# Patient Record
Sex: Male | Born: 1995 | Race: Black or African American | Hispanic: No | Marital: Single | Smoking: Never smoker
Health system: Southern US, Community
[De-identification: ages and names within clinical notes are randomized; demographics above are authoritative.]

## PROBLEM LIST (undated history)

## (undated) DIAGNOSIS — F840 Autistic disorder: Secondary | ICD-10-CM

## (undated) DIAGNOSIS — F909 Attention-deficit hyperactivity disorder, unspecified type: Secondary | ICD-10-CM

## (undated) HISTORY — DX: Attention-deficit hyperactivity disorder, unspecified type: F90.9

## (undated) HISTORY — DX: Autistic disorder: F84.0

---
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
## 2015-03-24 ENCOUNTER — Ambulatory Visit: Payer: Medicaid Other | Admitting: Developmental - Behavioral Pediatrics

## 2015-05-27 ENCOUNTER — Ambulatory Visit: Payer: Medicaid Other | Admitting: Developmental - Behavioral Pediatrics

## 2015-06-23 ENCOUNTER — Other Ambulatory Visit: Payer: Self-pay | Admitting: *Deleted

## 2015-06-23 DIAGNOSIS — F902 Attention-deficit hyperactivity disorder, combined type: Secondary | ICD-10-CM

## 2015-06-23 NOTE — Telephone Encounter (Signed)
Mom called asking for refill for amphetamine-dextroamphetamine (ADDERALL) 5 MG tablet

## 2015-06-24 MED ORDER — AMPHETAMINE-DEXTROAMPHETAMINE 5 MG PO TABS: ORAL_TABLET | ORAL | Status: DC

## 2015-06-24 NOTE — Addendum Note (Signed)
Addended by: Leatha GildingGERTZ, Chasta Deshpande S on: 06/24/2015 10:39 AM   Modules accepted: Orders

## 2015-06-24 NOTE — Telephone Encounter (Signed)
Please call this parent and ask if Brendan Garcia has had any medical changes since last visit.  If not she can pick up prescription for 15 tabs to get him through school until appt with Brendan Garcia 07-04-15.  Let her know that she no showed twice and Brendan Garcia has not seen Brendan Garcia since 09-2015

## 2015-06-29 NOTE — Telephone Encounter (Signed)
TC to mom. States that there have been no medical changes for pt. States that she will wait until appt 07/04/15 with Inda CokeGertz for med refill. Mom will report any problems from teachers at that time.

## 2015-07-04 ENCOUNTER — Ambulatory Visit (INDEPENDENT_AMBULATORY_CARE_PROVIDER_SITE_OTHER): Payer: Medicaid Other | Admitting: Developmental - Behavioral Pediatrics

## 2015-07-04 ENCOUNTER — Telehealth: Payer: Self-pay

## 2015-07-04 ENCOUNTER — Encounter: Payer: Self-pay | Admitting: Developmental - Behavioral Pediatrics

## 2015-07-04 VITALS — BP 131/66 | HR 80 | Ht 73.78 in | Wt 153.0 lb

## 2015-07-04 DIAGNOSIS — F840 Autistic disorder: Secondary | ICD-10-CM | POA: Diagnosis not present

## 2015-07-04 DIAGNOSIS — F79 Unspecified intellectual disabilities: Secondary | ICD-10-CM | POA: Diagnosis not present

## 2015-07-04 DIAGNOSIS — F902 Attention-deficit hyperactivity disorder, combined type: Secondary | ICD-10-CM

## 2015-07-04 MED ORDER — AMPHETAMINE-DEXTROAMPHETAMINE 5 MG PO TABS: ORAL_TABLET | ORAL | Status: DC

## 2015-07-04 NOTE — Patient Instructions (Addendum)
Call [employer] and ask them to walk you through putting parent controls on the electronics  Ask teacher to laminate pictures for steps in bathing

## 2015-07-04 NOTE — Telephone Encounter (Signed)
Doctor's note faxed to school/pt came today for an appt.

## 2015-07-04 NOTE — Progress Notes (Signed)
Brendan Garcia was referred by Dr. Carlynn Purl for management of ADHD and ASD  Problem: Autism/ADHD  Notes on problem: Brendan Garcia has been doing well in his self contained classroom this school year. 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 There have not been any recent behavior problems.  He only takes the Adderall 7.5mg  qam and at lunch for school.   There are no SE when he takes the Adderall. He is eating well and growth is excellent. Given information today on guardianship and encouraged to complete necessary paperwork.  He is having some problems with washing during his bath so we discussed using picture step directions  He had chromosomes - nl and Fragile X testing - negative in 2013  01-2001: [employer] binet Verbal: 57 Visual: 55 Short Term Mem: 43 FS IQ: 49 Adaptive: 54   Medications and therapies  He is on Adderall 7.5mg  qam and Adderall 7.5 mg at lunch for school only  Therapies tried include none   Rating scales   NICHQ Vanderbilt Assessment Scale, Parent Informant  Completed by: PGM- legal guardian  Date Completed: 07-04-15   Results Total number of questions score 2 or 3 in questions #1-9 (Inattention): 0 Total number of questions score 2 or 3 in questions #10-18 (Hyperactive/Impulsive):   0 Total number of questions scored 2 or 3 in questions #19-40 (Oppositional/Conduct):  0 Total number of questions scored 2 or 3 in questions #41-43 (Anxiety Symptoms): 0 Total number of questions scored 2 or 3 in questions #44-47 (Depressive Symptoms): 0  Performance (1 is excellent, 2 is above average, 3 is average, 4 is somewhat of a problem, 5 is problematic) Overall School Performance:   3 Relationship with parents:   3 Relationship with siblings:  3 Relationship with peers:  3  Participation in organized activities:   3  Academics  He [place] in self contained [employer] and works at [employer] one day each week- Ms. Hollice Espy IEP in  place? yes  Details on school communication and/or academic progress: He is doing better with reading and communication   Media time  Total hours per day of media time: less than 2 hrs per day  Media time monitored? yes   Sleep  Changes in sleep routine: No problems   Eating  Changes in appetite: no-- eating well  Current BMI percentile: 11th percentile  Within last 6 months, has child seen nutritionist? no   Mood  What is general mood? good  Happy? yes  Sad? no  Irritable? no  Negative spoken thoughts? no   Medication side effects  Headaches: no  Stomach aches: no  Tic(s): no   Review of systems - had PE 03-2014 and passed hearing and vision screening Constitutional  Denies: fever, abnormal weight change  Eyes  Denies: concerns about vision  HENT  Denies: concerns about hearing, snoring  Cardiovascular  Denies: chest pain, irregular heartbeats, rapid heart rate, syncope, lightheadedness, dizziness  Gastrointestinal  Denies: abdominal pain, loss of appetite, constipation  Genitourinary  Denies: bedwetting  Integument  Denies: changes in existing skin lesions or moles  Neurologic--speech difficulties  Denies: seizures, tremors, headaches, loss of balance, staring spells  Psychiatric--compulsive behaviors, poor social interaction,  Denies: anxiety, depression, hyperactivity, obsessions, sensory integration problems  Allergic-Immunologic  Denies: seasonal allergies   Physical Examination  BP 131/66 mmHg  Pulse 80  Ht 6' 1.78" (1.874 m)  Wt 153 lb (69.4 kg)  BMI 19.76 kg/m2 Blood pressure percentiles are 69% systolic and 14% diastolic based on 2000 NHANES data.  Constitutional  Appearance: well-nourished, well-developed, alert and well-appearing  Head  Inspection/palpation: normocephalic, symmetric  Respiratory  Respiratory effort: even, unlabored breathing  Auscultation of lungs: breath sounds symmetric and clear   Cardiovascular  Heart  Auscultation of heart: regular rate, no audible murmur, normal S1, normal S2  Gastrointestinal  Abdominal exam: abdomen soft, nontender  Liver and spleen: no hepatomegaly, no splenomegaly  Neurologic  Mental status exam  Orientation: oriented to time, place and person, appropriate for age  Speech/language: speech development abnormal for age, level of language comprehension abnormal for age  Attention: attention span and concentration inappropriate for age  Naming/repeating: names objects, repeats phrases, follows commands  Cranial nerves:  Optic nerve: vision grossly intact bilaterally, peripheral vision normal to confrontation, pupillary response to light brisk  Oculomotor nerve: eye movements within normal limits, no nsytagmus present, no ptosis present  Trochlear nerve: eye movements within normal limits  Trigeminal nerve: facial sensation normal bilaterally, masseter strength intact bilaterally  Abducens nerve: lateral rectus function normal bilaterally  Facial nerve: no facial weakness  Vestibuloacoustic nerve: hearing intact bilaterally  Spinal accessory nerve: shoulder shrug and sternocleidomastoid strength normal  Hypoglossal nerve: tongue movements normal  Motor exam  General strength, tone, motor function: strength normal and symmetric, normal central tone  Gait and station  Gait screening: normal gait, able to stand without difficulty, able to balance   Assessment  Autism spectrum disorder  ADHD (attention deficit hyperactivity disorder), combined type  Intellectual disability   Plan  Instructions  - Use positive parenting techniques.  - Call the clinic at [phone] with any further questions or concerns.  - Follow up with Dr. Inda CokeGertz in 12 weeks - The Autism Society of [place] offers helful information about resources in the community. The [place] office number is [phone].  - Another  [place] resource is Dentistamily Support Network at [phone].  - Limit all screen time to 2 hours or less per day. Monitor content to avoid exposure to violence, sex, and drugs.  - Show affection and respect for your child. Praise your child. Demonstrate healthy anger management.  - Reviewed old records and/or current chart.  - >50% of visit spent on counseling/coordination of care: 20 minutes out of total 30 minutes.  - Continue Adderall 7.5 mg qam and Adderall 7.5 mg at lunch for school -2 months given today - IEP in place in Au self contained classroom  - Call [employer] and ask them to walk you through putting parent controls on the electronics - Ask teacher to laminate pictures for steps in bathing    Frederich Chaale Sussman Dannae Kato, MD   Developmental-Behavioral Pediatrician  [facility]  [address]  [address]  [place], [place] [address]  [phone] Office  [phone] Fax  [email or web address]

## 2015-07-05 ENCOUNTER — Telehealth: Payer: Self-pay | Admitting: *Deleted

## 2015-07-05 DIAGNOSIS — F902 Attention-deficit hyperactivity disorder, combined type: Secondary | ICD-10-CM

## 2015-07-05 MED ORDER — AMPHETAMINE-DEXTROAMPHETAMINE 5 MG PO TABS: ORAL_TABLET | ORAL | Status: DC

## 2015-07-05 NOTE — Telephone Encounter (Signed)
Please call mom and tell her that I am sorry for the error.  Prescription has been rewritten and is at the front desk to pick up

## 2015-07-05 NOTE — Telephone Encounter (Signed)
Vm from mom. States that at [facility] on Monday, pt was given a rx for 15 pills, but pt takes 3 pills per day, and this rx will only last him a week. Mom has filled rx, did not send to school. Pharmacy is unable to fill rx writted for 93 pills d/t "Do not fill before 08/04/15" written on rx. Mom states she needs refill on medication.

## 2015-07-05 NOTE — Addendum Note (Signed)
Addended by: Leatha Gilding on: 07/05/2015 12:22 PM   Modules accepted: Orders

## 2015-07-05 NOTE — Telephone Encounter (Signed)
TC to mom. Apologized for the error. Prescription has been rewritten and is at the front desk to pick up. Mom agreeable to pick up rx.

## 2015-07-06 ENCOUNTER — Emergency Department (HOSPITAL_COMMUNITY): Payer: Medicaid Other

## 2015-07-06 ENCOUNTER — Emergency Department (HOSPITAL_COMMUNITY)
Admission: EM | Admit: 2015-07-06 | Discharge: 2015-07-06 | Disposition: A | Discharge status: Home / Self Care | Payer: Medicaid Other | Attending: Emergency Medicine | Admitting: Emergency Medicine

## 2015-07-06 ENCOUNTER — Encounter (HOSPITAL_COMMUNITY): Payer: Self-pay | Admitting: Emergency Medicine

## 2015-07-06 DIAGNOSIS — F909 Attention-deficit hyperactivity disorder, unspecified type: Secondary | ICD-10-CM | POA: Insufficient documentation

## 2015-07-06 DIAGNOSIS — Z79899 Other long term (current) drug therapy: Secondary | ICD-10-CM | POA: Insufficient documentation

## 2015-07-06 DIAGNOSIS — W108XXA Fall (on) (from) other stairs and steps, initial encounter: Secondary | ICD-10-CM | POA: Insufficient documentation

## 2015-07-06 DIAGNOSIS — Y998 Other external cause status: Secondary | ICD-10-CM | POA: Insufficient documentation

## 2015-07-06 DIAGNOSIS — F840 Autistic disorder: Secondary | ICD-10-CM | POA: Diagnosis not present

## 2015-07-06 DIAGNOSIS — Y92219 Unspecified school as the place of occurrence of the external cause: Secondary | ICD-10-CM | POA: Insufficient documentation

## 2015-07-06 DIAGNOSIS — S29001A Unspecified injury of muscle and tendon of front wall of thorax, initial encounter: Secondary | ICD-10-CM | POA: Diagnosis present

## 2015-07-06 DIAGNOSIS — Y9302 Activity, running: Secondary | ICD-10-CM | POA: Diagnosis not present

## 2015-07-06 DIAGNOSIS — R0781 Pleurodynia: Secondary | ICD-10-CM

## 2015-07-06 NOTE — ED Notes (Signed)
Pt fell while running up steps at school yesterday. C/o mid back pain, predominately on left side. Pt is AUTISTIC.

## 2015-07-06 NOTE — Discharge Instructions (Signed)

## 2015-07-06 NOTE — ED Provider Notes (Signed)
CSN: [id number]     Arrival date & time 07/06/15  1242 History  By signing my name below, I, Essence Howell, attest that this documentation has been prepared under the direction and in the presence of Alveta Heimlich, PA-C Electronically Signed: Charline Bills, ED Scribe 07/06/2015 at 3:05 PM.   Chief Complaint  Patient presents with  . Fall   The history is provided by the patient and a relative. No language interpreter was used.   Level V caveat due to autism  HPI Comments: MYRICK MCNAIRY is a 20 y.o. male, with a h/o autism, brought in by guardian, who presents to the Emergency Department complaining of a mechanical fall that occurred last night. Pt states that he fell yesterday while running up the steps. He reports constant mid back pain since the fall yesterday that is exacerbated with standing. Denies hitting his head. No treatments tried PTA. Patient's guardian is at bedside and states that she is unsure when this fall occurred. She states that he was not complaining of pain this morning before school. She received a call from the nurse at his school telling her that he was complaining of pain from a fall yesterday and wanted to go home. She states he is at baseline and has not been complaining of any other symptoms recently.   Past Medical History  Diagnosis Date  . Autism   . ADHD (attention deficit hyperactivity disorder)    History reviewed. No pertinent past surgical history. No family history on file. Social History  Substance Use Topics  . Smoking status: Never Smoker   . Smokeless tobacco: Never Used  . Alcohol Use: None    Review of Systems  Unable to perform ROS: Psychiatric disorder  Musculoskeletal: Positive for back pain.   Allergies  Review of patient's allergies indicates no known allergies.  Home Medications   Prior to Admission medications   Medication Sig Start Date End Date Taking? Authorizing Provider  amphetamine-dextroamphetamine (ADDERALL) 5 MG tablet  Take 1 1/2 tab (7.5mg ) every morning and 1 1/2 tab(7.5mg ) at lunch 07/04/15   Leatha Gilding, MD  amphetamine-dextroamphetamine (ADDERALL) 5 MG tablet Take 1 1/2 tab (7.5mg ) by mouth every morning and 1 1/2 tab (7.5mg ) by mouth everyday at lunch 07/05/15   Leatha Gilding, MD  Selenium Sulfide 2.25 % SHAM Apply 1 application topically daily. Use daily x 1 week. Leave on for 10 minutes then wash.  Then one day a week. Patient not taking: Reported on 07/04/2015 03/29/14   Keith Rake, MD   BP 133/86 mmHg  Pulse 84  Temp(Src) 98.1 F (36.7 C) (Oral)  Resp 16  Ht  (1.854 m)  Wt 153 lb (69.4 kg)  BMI 20.19 kg/m2  SpO2 99% Physical Exam  Constitutional: He appears well-developed and well-nourished. No distress.  Autistic. Poor eye contact. Answers some simple questions. Poor historian  HENT:  Head: Normocephalic and atraumatic.  Right Ear: External ear normal.  Left Ear: External ear normal.  Eyes: Conjunctivae are normal. Right eye exhibits no discharge. Left eye exhibits no discharge. No scleral icterus.  Neck: Normal range of motion.  Cardiovascular: Normal rate and normal heart sounds.   Pulmonary/Chest: Effort normal and breath sounds normal.  Musculoskeletal: Normal range of motion.       Thoracic back: He exhibits tenderness. He exhibits normal range of motion, no swelling, no deformity and no spasm.       Back:  Mild tenderness over the thoracic region. No focal  tenderness over the thoracic or lumbar spine. No bony deformities or step-offs. Full range of motion of the cervical, thoracic and lumbar and intact. Moves all extremities spontaneously and without pain. Walks with a steady gait.  Neurological: He is alert. Coordination normal.  Skin: Skin is warm and dry.  Psychiatric: He has a normal mood and affect. His behavior is normal.  Nursing note and vitals reviewed.  ED Course  Procedures (including critical care time) DIAGNOSTIC STUDIES: Oxygen Saturation is 99% on RA,  normal by my interpretation.    COORDINATION OF CARE: 1:34 PM-Discussed treatment plan which includes XR with pt at bedside and pt agreed to plan.   Labs Review Labs Reviewed - No data to display  Imaging Review Dg Ribs Bilateral W/chest  07/06/2015  CLINICAL DATA:  Pain following fall EXAM: BILATERAL RIBS AND CHEST - 4+ VIEW COMPARISON:  None. FINDINGS: Frontal chest as well as oblique and cone-down lower rib images obtained. Lungs are clear. Heart size and pulmonary vascularity are normal. No adenopathy. No pneumothorax or effusion. No rib fracture evident. IMPRESSION: No rib fracture evident.  Lungs clear. Electronically Signed   By: Bretta Bang III M.D.   On: 07/06/2015 14:24   I have personally reviewed and evaluated these images and lab results as part of my medical decision-making.   EKG Interpretation None      MDM   Final diagnoses:  Rib pain   20 year old male presenting with posterior bilateral rib pain after a fall. Patient is autistic and a poor historian. Level V caveat. Patient's guardian is at bedside states she did not witness the fall last evening. She is unsure about the details of the fall. She states that she just wanted to bring him to the emergency department for a chest x-ray "to make sure nothing is broken". Vital signs stable. Patient is nontoxic-appearing. Mild tenderness in the posterior thoracic region. No bony deformities. Patient retains full range of motion of the back and all extremities. Patient walks with a steady gait. Due to patient being poor historian, obtained ribs x-ray to rule out acute injury. X-ray negative. Patient discharged with instruction to follow-up with his primary care provider if symptoms persist. Instructed to use Tylenol or ibuprofen for pain. Patient's guardian expresses understanding and agrees with the plan. Return precautions given in discharge paperwork and discussed with pt at bedside. Pt stable for discharge  I personally  performed the services described in this documentation, which was scribed in my presence. The recorded information has been reviewed and is accurate.    Alveta Heimlich, PA-C 07/06/15 1520  Blane Ohara, MD 07/06/15 [phone]

## 2015-07-06 NOTE — ED Notes (Signed)
Pt states he tripped going up his steps last night and feel down two steps inside his house. Per mom pt went to school this morning and told his teachers that he fell last night and his lower back was hurting. Pt is ambulatory and triage and just states his lower back hurts. Hx of Autism.

## 2015-08-22 ENCOUNTER — Encounter: Payer: Self-pay | Admitting: Pediatrics

## 2015-08-22 ENCOUNTER — Ambulatory Visit (INDEPENDENT_AMBULATORY_CARE_PROVIDER_SITE_OTHER): Payer: Medicaid Other | Admitting: Pediatrics

## 2015-08-22 VITALS — BP 110/64 | Ht 73.0 in | Wt 152.6 lb

## 2015-08-22 DIAGNOSIS — F79 Unspecified intellectual disabilities: Secondary | ICD-10-CM

## 2015-08-22 DIAGNOSIS — F840 Autistic disorder: Secondary | ICD-10-CM

## 2015-08-22 DIAGNOSIS — Z0001 Encounter for general adult medical examination with abnormal findings: Secondary | ICD-10-CM

## 2015-08-22 DIAGNOSIS — Z113 Encounter for screening for infections with a predominantly sexual mode of transmission: Secondary | ICD-10-CM

## 2015-08-22 DIAGNOSIS — Z68.41 Body mass index (BMI) pediatric, 5th percentile to less than 85th percentile for age: Secondary | ICD-10-CM

## 2015-08-22 DIAGNOSIS — R6889 Other general symptoms and signs: Secondary | ICD-10-CM

## 2015-08-22 DIAGNOSIS — F902 Attention-deficit hyperactivity disorder, combined type: Secondary | ICD-10-CM | POA: Diagnosis not present

## 2015-08-22 NOTE — Progress Notes (Signed)
Adolescent Well Care Visit Brendan Garcia is a 20 y.o. male who is here for well care. Brendan Garcia is a young man with autism, ADHD and intellectual disability.     PCP:  Duffy Rhody Medication management for ADHD is with Dr. Kem Boroughs; 7.5 mg of Adderall at breakfast and at lunch.   History was provided by the patient and grandmother.  Current Issues: Current concerns include he is doing well.   Nutrition: Nutrition/Eating Behaviors: eats a good variety Adequate calcium in diet?: yes Supplements/ Vitamins: no  Exercise/ Media: Play any Sports?/ Exercise: likes to play basket ball with friends/family Screen Time:  not quantified but likes video games Media Rules or Monitoring?: yes  Sleep:  Sleep: sleeps well through the night  Social Screening: Lives with:  Paternal grandmother, aunt, uncle and cousin Parental relations:  good with dad who spends some time with Brendan Garcia; mom is not very involved. Both live in [place], separately. Activities, Work, and Regulatory affairs officer?: works 1/2 day on Friday at [employer]. Very helpful at home (washes dishes, sweeps, keeps his bedroom & bathroom clean, puts away his clothing). Concerns regarding behavior with peers?  States he does not really have friends at school but names 2 friends at work; states they help him. Stressors of note: none stated  Education: School Name: [employer] Grade: he is in a self-contained classroom and can continue until age 72 years School performance: doing well; no concerns School Behavior: tells MD he needs to work on good behavior and states he was recently hitting himself  Menstruation:   No LMP for male patient. Menstrual History: n/a for male   Confidentiality was discussed with the patient and, if applicable, with caregiver as well. Patient's personal or confidential phone number: contact is grandmother  Tobacco?  no Secondhand smoke exposure?  no Drugs/ETOH?  no  Sexually Active?  no    Pregnancy Prevention: abstinence  Safe at home, in school & in relationships?  Yes Safe to self?  Yes   Screenings: Patient has a dental home: yes  The patient completed the Rapid Assessment for Adolescent Preventive Services screening questionnaire and the following topics were identified as risk factors and discussed: exercise and mental health issues  In addition, the following topics were discussed as part of anticipatory guidance sexuality and social isolation.  PHQ-9 completed and results indicated no problems  Physical Exam:  Filed Vitals:   08/22/15 1206  BP: 110/64  Height:  (1.854 m)  Weight: 152 lb 9.6 oz (69.219 kg)   BP 110/64 mmHg  Ht  (1.854 m)  Wt 152 lb 9.6 oz (69.219 kg)  BMI 20.14 kg/m2 Body mass index: body mass index is 20.14 kg/(m^2). Blood pressure percentiles are 8% systolic and 10% diastolic based on 2000 NHANES data. Blood pressure percentile targets: 90: 139/94, 95: 143/98, 99 + 5 mmHg: 155/111.   Hearing Screening   Method: Audiometry           Right ear:   40 20 20 40   Left ear:   40 20  25 40     Visual Acuity Screening   Right eye Left eye Both eyes  Without correction:  With correction:       General Appearance:   alert, oriented, no acute distress; very pleasant and conversive with soft, thoughtful speech  HENT: Normocephalic, no obvious abnormality, conjunctiva clear  Mouth:   Normal appearing teeth, no obvious discoloration, dental caries, or  dental caps  Neck:   Supple; thyroid: no enlargement, symmetric, no tenderness/mass/nodules  Chest normal male, thin pectoral mass  Lungs:   Clear to auscultation bilaterally, normal work of breathing  Heart:   Regular rate and rhythm, S1 and S2 normal, no murmurs;   Abdomen:   Soft, non-tender, no mass, or organomegaly  GU normal male genitals, no testicular masses or hernia, Tanner stage 4  Musculoskeletal:   Tone and  strength strong and symmetrical, all extremities               Lymphatic:   No cervical adenopathy  Skin/Hair/Nails:   Skin warm, dry and intact, no rashes, no bruises or petechiae; chapped lower lip  Neurologic:   Strength, gait, and coordination normal and age-appropriate     Assessment and Plan:   1. Encounter for general adult medical examination with abnormal findings   2. Body mass index, pediatric, 5th percentile to less than 85th percentile for age   523. Routine screening for STI (sexually transmitted infection)   4. Intellectual disability   5. Autism spectrum disorder   6. ADHD (attention deficit hyperactivity disorder), combined type     BMI is appropriate for age  Hearing screening result:normal Vision screening result: normal  No vaccines today; [facility]. Orders Placed This Encounter  Procedures  . GC/Chlamydia Probe Amp  . POCT Rapid HIV    Advised daily multivitamin for adequate Vitamin D in diet.  Discussed personal safety issues. Advised they check with the Autism Society for appropriate social events; provided with March calendar of events to give GM an idea of offerings. Advised GM to seek legal advice on benefits of obtaining legal guardianship of Brendan Moneyony and specifying future caretaker in event GM becomes unable to provide care.    Return for annual PE and prn acute care.  Maree ErieStanley, Angela J, MD

## 2015-08-22 NOTE — Patient Instructions (Signed)
You may wish to contact the [facility] for advice on legal representation for establishing legal guardianship of Brendan Garcia. Explain that he is a legal adult with cognitive differences, autism and documented lower IQ.  Contact the Autism Society [place].  Tinea Versicolor Tinea versicolor is a common fungal infection of the skin. It causes a rash that appears as light or dark patches on the skin. The rash most often occurs on the chest, back, neck, or upper arms. This condition is more common during warm weather. Other than affecting how your skin looks, tinea versicolor usually does not cause other problems. In most cases, the infection goes away in a few weeks with treatment. It may take a few months for the patches on your skin to clear up. CAUSES Tinea versicolor occurs when a type of fungus that is normally present on the skin starts to overgrow. This fungus is a kind of yeast. The exact cause of the overgrowth is not known. This condition cannot be passed from one person to another (noncontagious). RISK FACTORS This condition is more likely to develop when certain factors are present, such as:  Heat and humidity.  Sweating too much.  Hormone changes.  Oily skin.  A weak defense (immune) system. SYMPTOMS Symptoms of this condition may include:  A rash on your skin that is made up of light or dark patches. The rash may have:  Patches of tan or pink spots on light skin.  Patches of white or brown spots on dark skin.  Patches of skin that do not tan.  Well-marked edges.  Scales on the discolored areas.  Mild itching. DIAGNOSIS A health care provider can usually diagnose this condition by looking at your skin. During the exam, he or she may use ultraviolet light to help determine the extent of the infection. In some cases, a skin sample may be taken by scraping the rash. This sample will be viewed under a microscope to check for yeast  overgrowth. TREATMENT Treatment for this condition may include:  Dandruff shampoo that is applied to the affected skin during showers or bathing.  Over-the-counter medicated skin cream, lotion, or soaps.  Prescription antifungal medicine in the form of skin cream or pills.  Medicine to help reduce itching. HOME CARE INSTRUCTIONS  Take medicines only as directed by your health care provider.  Apply dandruff shampoo to the affected area if told to do so by your health care provider. You may be instructed to scrub the affected skin for several minutes each day.  Do not scratch the affected area of skin.  Avoid hot and humid conditions.  Do not use tanning booths.  Try to avoid sweating a lot. SEEK MEDICAL CARE IF:  Your symptoms get worse.  You have a fever.  You have redness, swelling, or pain at the site of your rash.  You have fluid, blood, or pus coming from your rash.  Your rash returns after treatment.   This information is not intended to replace advice given to you by your health care provider. Make sure you discuss any questions you have with your health care provider.   Document Released: 06/01/2000 Document Revised: 06/25/2014 Document Reviewed: 03/16/2014 Elsevier Interactive Patient Education [employer]2016 Elsevier Inc.

## 2015-08-23 ENCOUNTER — Encounter: Payer: Self-pay | Admitting: Pediatrics

## 2015-08-23 LAB — GC/CHLAMYDIA PROBE AMP
CT Probe RNA: NOT DETECTED
GC Probe RNA: NOT DETECTED

## 2015-10-03 ENCOUNTER — Encounter: Payer: Self-pay | Admitting: *Deleted

## 2015-10-03 ENCOUNTER — Encounter: Payer: Self-pay | Admitting: Developmental - Behavioral Pediatrics

## 2015-10-03 ENCOUNTER — Ambulatory Visit (INDEPENDENT_AMBULATORY_CARE_PROVIDER_SITE_OTHER): Payer: Medicaid Other | Admitting: Developmental - Behavioral Pediatrics

## 2015-10-03 VITALS — BP 120/90 | HR 98 | Ht 74.25 in | Wt 156.6 lb

## 2015-10-03 DIAGNOSIS — Z00121 Encounter for routine child health examination with abnormal findings: Secondary | ICD-10-CM | POA: Diagnosis not present

## 2015-10-03 DIAGNOSIS — F840 Autistic disorder: Secondary | ICD-10-CM

## 2015-10-03 DIAGNOSIS — F902 Attention-deficit hyperactivity disorder, combined type: Secondary | ICD-10-CM

## 2015-10-03 DIAGNOSIS — F79 Unspecified intellectual disabilities: Secondary | ICD-10-CM

## 2015-10-03 MED ORDER — AMPHETAMINE-DEXTROAMPHETAMINE 5 MG PO TABS: ORAL_TABLET | ORAL | Status: AC

## 2015-10-03 MED ORDER — AMPHETAMINE-DEXTROAMPHETAMINE 5 MG PO TABS
ORAL_TABLET | ORAL | Status: AC
Start: 2015-10-03 — End: ?

## 2015-10-03 NOTE — Patient Instructions (Addendum)
Re-schedule dental cleaning  Ask teacher to laminate pictures for steps in bathing

## 2015-10-03 NOTE — Progress Notes (Signed)
Brendan Garcia was referred by Dr. Carlynn Purl for management of ADHD and ASD  Problem: Autism/ADHD  Notes on problem: Brendan Garcia has been doing well in his self contained classroom this school year. 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 There have not been any recent behavior problems.  He only takes the Adderall 7.5mg  qam and at lunch for school, but his GM would like to try the adderall at home to determine if it helps Brendan Garcia with his focus so that his communication is improved.  There are no SE when he takes the Adderall. He is eating well and growth is excellent. Given information today on guardianship and encouraged to complete necessary paperwork.  He is having some problems with washing during his bath so we discussed using picture step directions.  He had chromosomes - nl and Fragile X testing - negative in 2013  01-2001: [employer] binet Verbal: 57 Visual: 55 Short Term Mem: 105 FS IQ: 49 Adaptive: 54   Medications and therapies  He is on Adderall 7.5mg  qam and Adderall 7.5 mg at lunch for school only  Therapies:  School teacher working on Scientist, research (medical) Vanderbilt Assessment Scale, Parent Informant  Completed by: [facility] care Guardian since birth  Date Completed: 10-03-15   Results Total number of questions score 2 or 3 in questions #1-9 (Inattention): 4 Total number of questions score 2 or 3 in questions #10-18 (Hyperactive/Impulsive):   2 Total number of questions scored 2 or 3 in questions #19-40 (Oppositional/Conduct):  0 Total number of questions scored 2 or 3 in questions #41-43 (Anxiety Symptoms): 0 Total number of questions scored 2 or 3 in questions #44-47 (Depressive Symptoms): 0  Performance (1 is excellent, 2 is above average, 3 is average, 4 is somewhat of a problem, 5 is problematic) Overall School Performance:   5 Relationship with parents:   3 Relationship with siblings:  3 Relationship with peers:   3  Participation in organized activities:   3   [facility] Vanderbilt Assessment Scale, Parent Informant  Completed by: PGM- legal guardian  Date Completed: 07-04-15   Results Total number of questions score 2 or 3 in questions #1-9 (Inattention): 0 Total number of questions score 2 or 3 in questions #10-18 (Hyperactive/Impulsive):   0 Total number of questions scored 2 or 3 in questions #19-40 (Oppositional/Conduct):  0 Total number of questions scored 2 or 3 in questions #41-43 (Anxiety Symptoms): 0 Total number of questions scored 2 or 3 in questions #44-47 (Depressive Symptoms): 0  Performance (1 is excellent, 2 is above average, 3 is average, 4 is somewhat of a problem, 5 is problematic) Overall School Performance:   3 Relationship with parents:   3 Relationship with siblings:  3 Relationship with peers:  3  Participation in organized activities:   3  Academics  He [place] in self contained [employer] and works at [employer] one day each week- Ms. Hollice Espy IEP in place? yes  Details on school communication and/or academic progress: He is doing better with reading and communication   Media time  Total hours per day of media time: less than 2 hrs per day  Media time monitored? yes   Sleep  Changes in sleep routine: No problems   Eating  Changes in appetite: no-- eating well  Current BMI percentile: 12th percentile  Within last 6 months, has child seen nutritionist?  no   Mood  What is general mood? good  Happy? yes  Sad? no  Irritable? no  Negative spoken thoughts? no   Medication side effects  Headaches: no  Stomach aches: no  Tic(s): no   Review of systems - had PE 08-22-15 Constitutional  Denies: fever, abnormal weight change  Eyes  Denies: concerns about vision  HENT  Denies: concerns about hearing, snoring  Cardiovascular  Denies: chest pain, irregular heartbeats, rapid heart rate, syncope, dizziness  Gastrointestinal  Denies:  abdominal pain, loss of appetite, constipation  Genitourinary  Denies: bedwetting  Integument  Denies: changes in existing skin lesions or moles  Neurologic--speech difficulties  Denies: seizures, tremors, headaches, loss of balance, staring spells  Psychiatric--compulsive behaviors, poor social interaction,  Denies: anxiety, depression, hyperactivity, obsessions, sensory integration problems  Allergic-Immunologic  Denies: seasonal allergies   Physical Examination  BP 120/90 mmHg  Pulse 98  Ht 6' 2.25" (1.886 m)  Wt 156 lb 9.6 oz (71.033 kg)  BMI 19.97 kg/m2 Blood pressure percentiles are 29% systolic and 81% diastolic based on 2000 NHANES data.  Constitutional  Appearance: well-nourished, well-developed, alert and well-appearing  Head  Inspection/palpation: normocephalic, symmetric  Respiratory  Respiratory effort: even, unlabored breathing  Auscultation of lungs: breath sounds symmetric and clear  Cardiovascular  Heart  Auscultation of heart: regular rate, no audible murmur, normal S1, normal S2  Gastrointestinal  Abdominal exam: abdomen soft, nontender  Liver and spleen: no hepatomegaly, no splenomegaly  Neurologic  Mental status exam  Orientation: oriented to time, place and person, appropriate for age  Speech/language: speech development abnormal for age, level of language comprehension abnormal for age  Attention: attention span and concentration inappropriate for age  Naming/repeating: names objects, repeats phrases, follows commands  Cranial nerves:  Optic nerve: vision grossly intact bilaterally, peripheral vision normal to confrontation, pupillary response to light brisk  Oculomotor nerve: eye movements within normal limits, no nsytagmus present, no ptosis present  Trochlear nerve: eye movements within normal limits  Trigeminal nerve: facial sensation normal bilaterally, masseter strength intact bilaterally  Abducens nerve:  lateral rectus function normal bilaterally  Facial nerve: no facial weakness  Vestibuloacoustic nerve: hearing intact bilaterally  Spinal accessory nerve: shoulder shrug and sternocleidomastoid strength normal  Hypoglossal nerve: tongue movements normal  Motor exam  General strength, tone, motor function: strength normal and symmetric, normal central tone  Gait and station  Gait screening: normal gait, able to stand without difficulty, able to balance   Assessment  Autism spectrum disorder  ADHD (attention deficit hyperactivity disorder), combined type  Intellectual disability   Plan  Instructions  - Use positive parenting techniques.  - Call the clinic at [phone] with any further questions or concerns.  - Follow up with Dr. Inda CokeGertz in 12 weeks - The Autism Society of [place] offers helful information about resources in the community. The [place] office number is [phone]. Brendan Moneyony is registered to go to camp this summer - Limit all screen time to 2 hours or less per day. Monitor content to avoid exposure to violence, sex, and drugs.  - Show affection and respect for your child. Praise your child. Demonstrate healthy anger management.  - Reviewed old records and/or current chart.  - >50% of visit spent on counseling/coordination of care: 20 minutes out of total 30 minutes.  - Continue Adderall 7.5 mg qam and Adderall 7.5 mg at lunch daily.  May be helpful to give everyday so GM will call for another prescription if she decides to  give daily when not in school.   -2 months given today - IEP in place in [place] self contained classroom  - Information given to [facility] for guardianship - Ask teacher to laminate pictures for steps in bathing    Brendan Cha, MD   Developmental-Behavioral Pediatrician  [facility]  [address]  [address]  [place], [place] [address]  [phone] Office  [phone] Fax   [email or web address]

## 2015-12-26 ENCOUNTER — Ambulatory Visit: Payer: Self-pay | Admitting: Developmental - Behavioral Pediatrics

## 2016-05-07 ENCOUNTER — Telehealth: Payer: Self-pay | Admitting: Pediatrics

## 2016-05-07 NOTE — Telephone Encounter (Signed)
Form placed in Dr. Stanley's folder for completion. 

## 2016-05-07 NOTE — Telephone Encounter (Signed)
Please call Mrs. Rheaume as soon form is ready for pick up @ [phone]

## 2016-05-11 NOTE — Telephone Encounter (Signed)
Completed form copied for medical record scanning; original placed at front desk. I called Ms. Pooley and told her form is ready for pick up.

## 2017-07-31 IMAGING — DX DG RIBS W/ CHEST 3+V BILAT
1 series · 1 of 1 positions shown · non-contrast
Comparison: None.

CLINICAL DATA: Pain following fall

EXAM:
BILATERAL RIBS AND CHEST - 4+ VIEW

[chest pa]
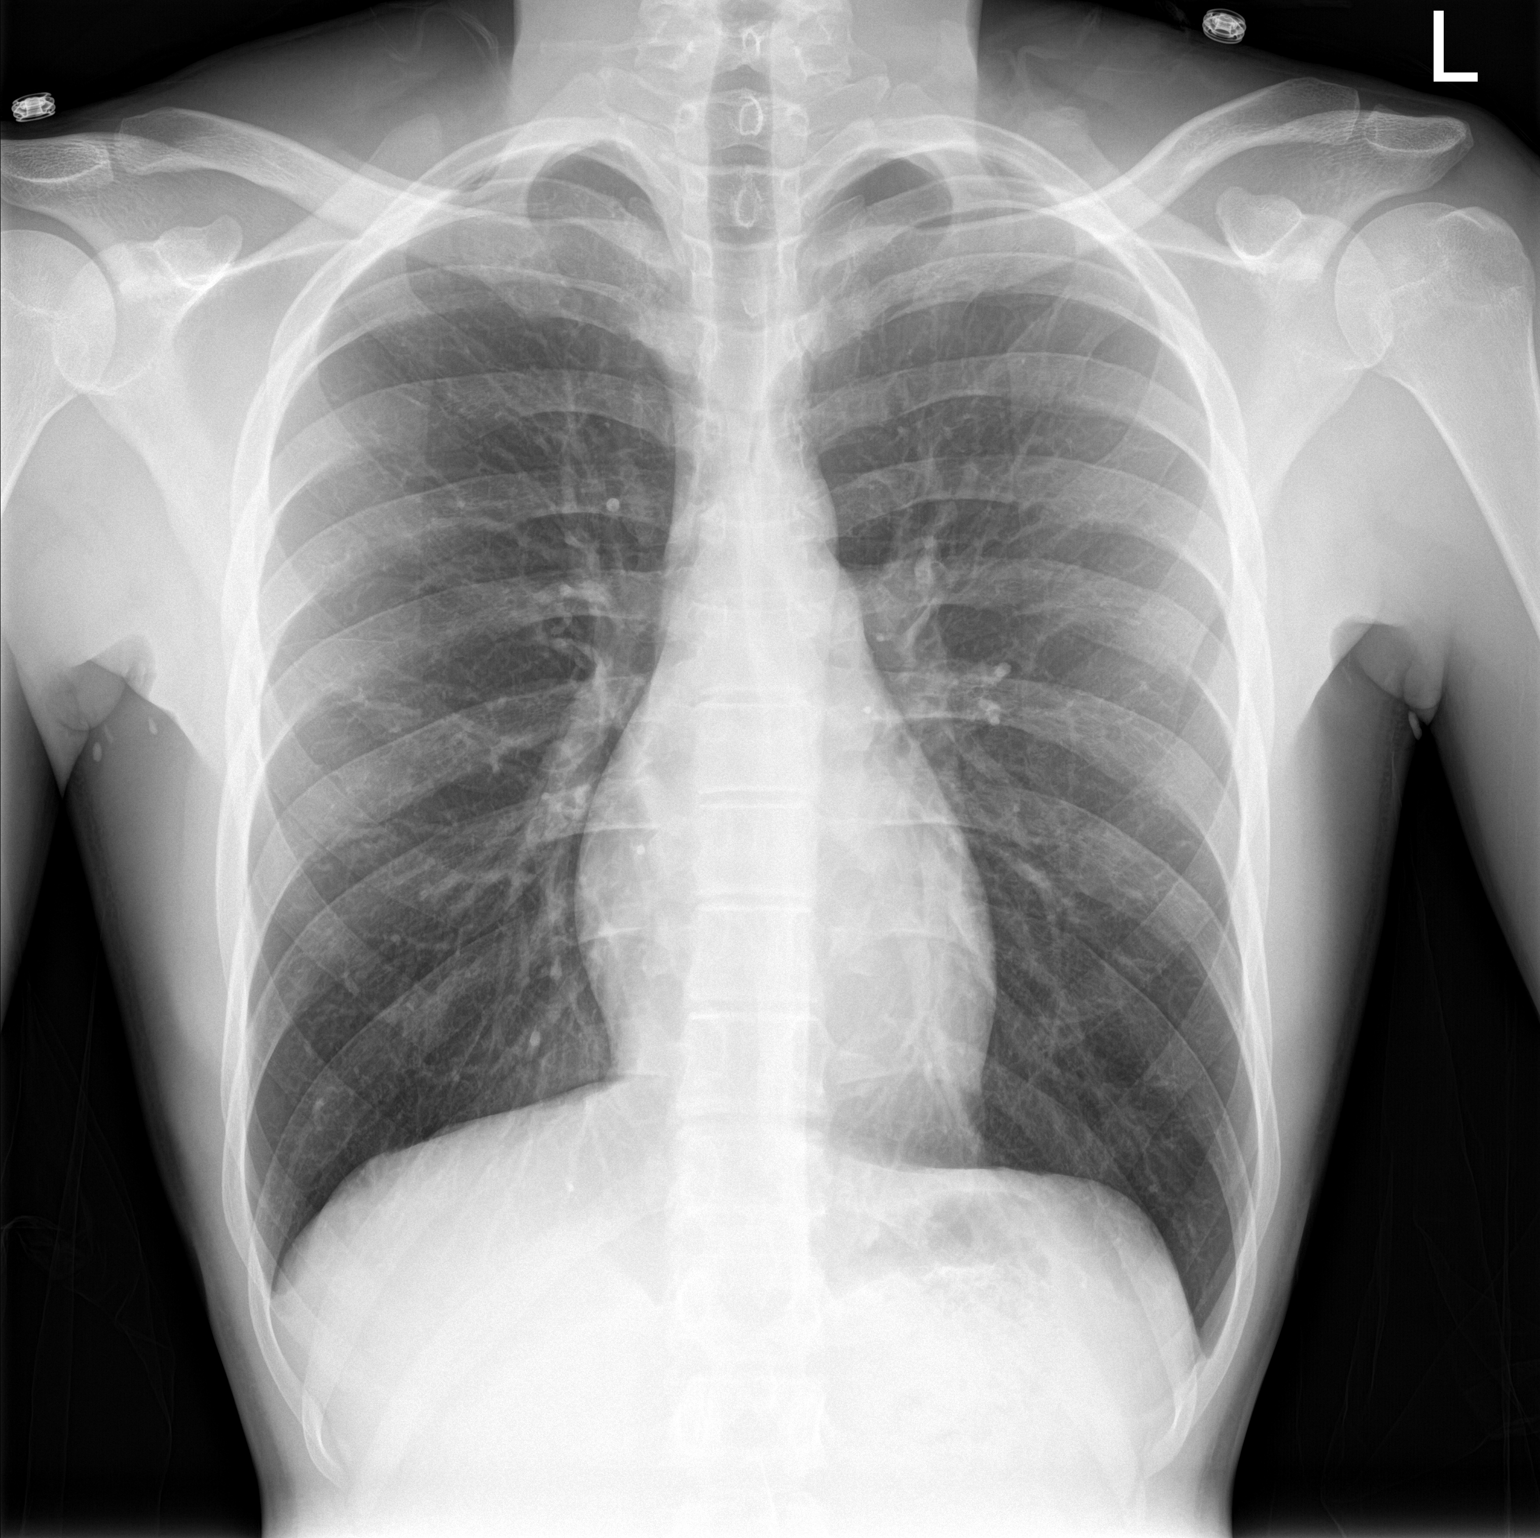

[1 of 1 positions shown; findings below may reference images not displayed]

FINDINGS: Frontal chest as well as oblique and cone-down lower rib images
obtained. Lungs are clear. Heart size and pulmonary vascularity are
normal. No adenopathy. No pneumothorax or effusion. No rib fracture
evident.
IMPRESSION: No rib fracture evident.  Lungs clear.

## 2017-07-31 IMAGING — DX DG RIBS W/ CHEST 3+V BILAT
5 series · 5 of 5 positions shown · non-contrast
Comparison: None.

CLINICAL DATA: Pain following fall

EXAM:
BILATERAL RIBS AND CHEST - 4+ VIEW

[rib pa]
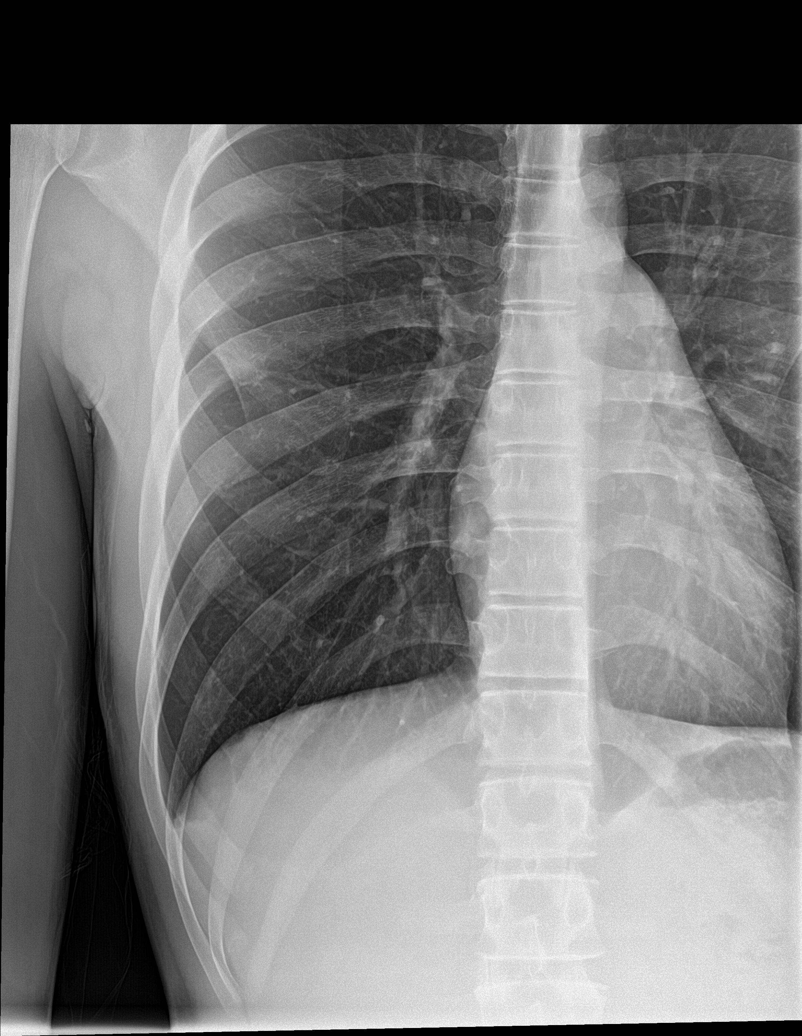

[rib pa obl (1 of 2)]
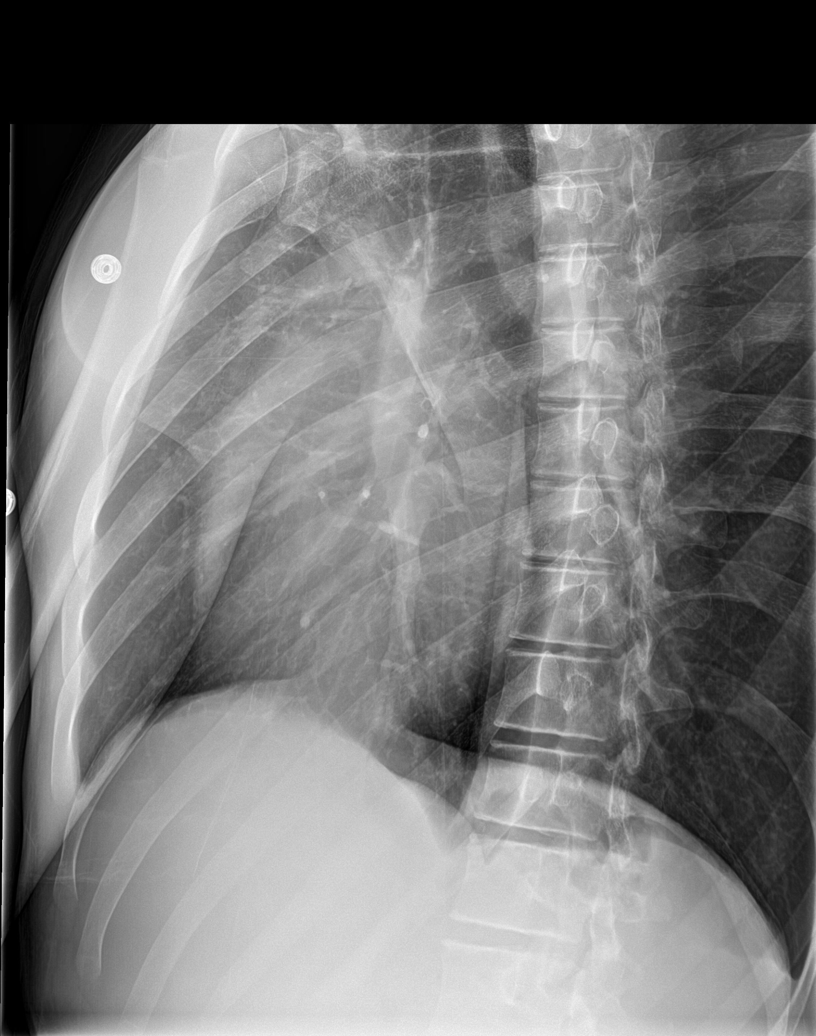

[rib pa obl (2 of 2)]
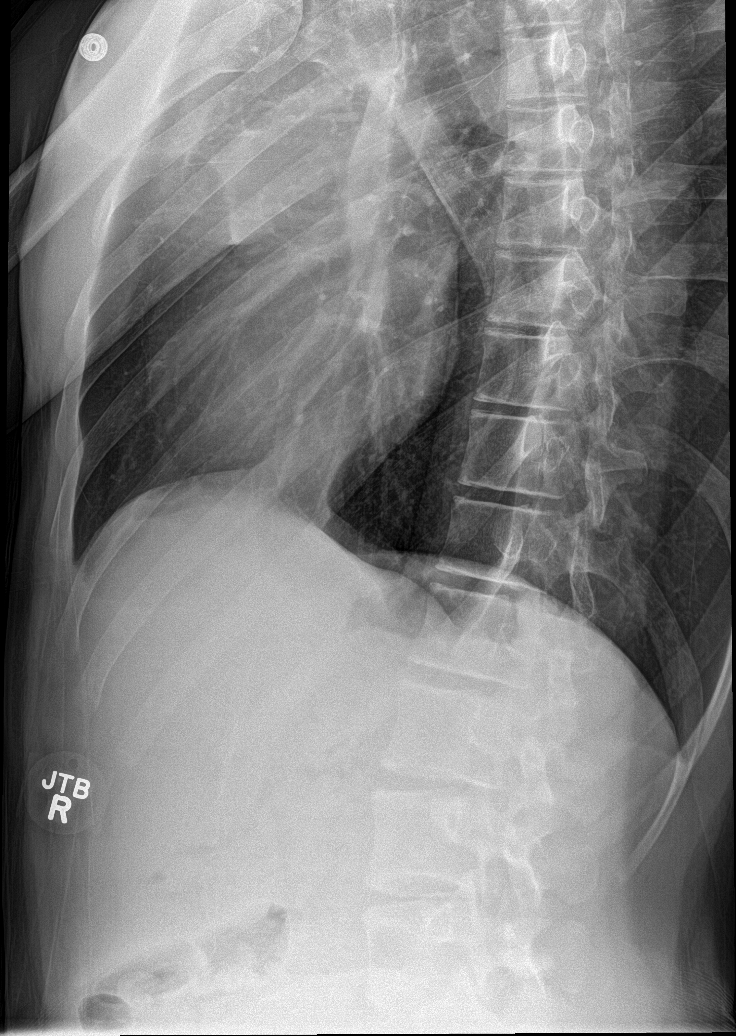

[rib ap]
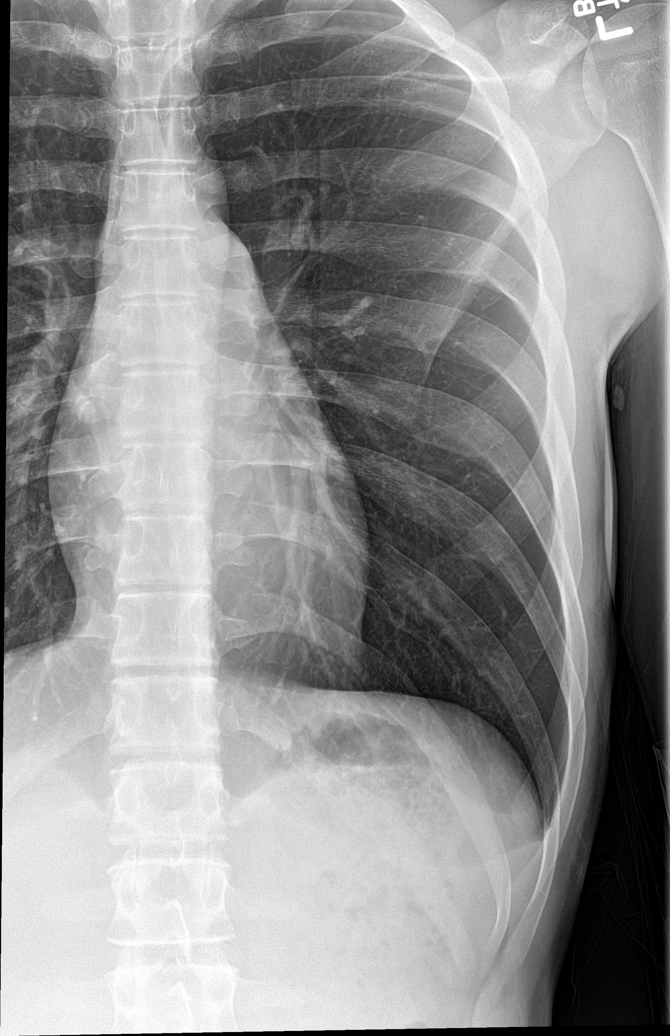

[rib ap obl]
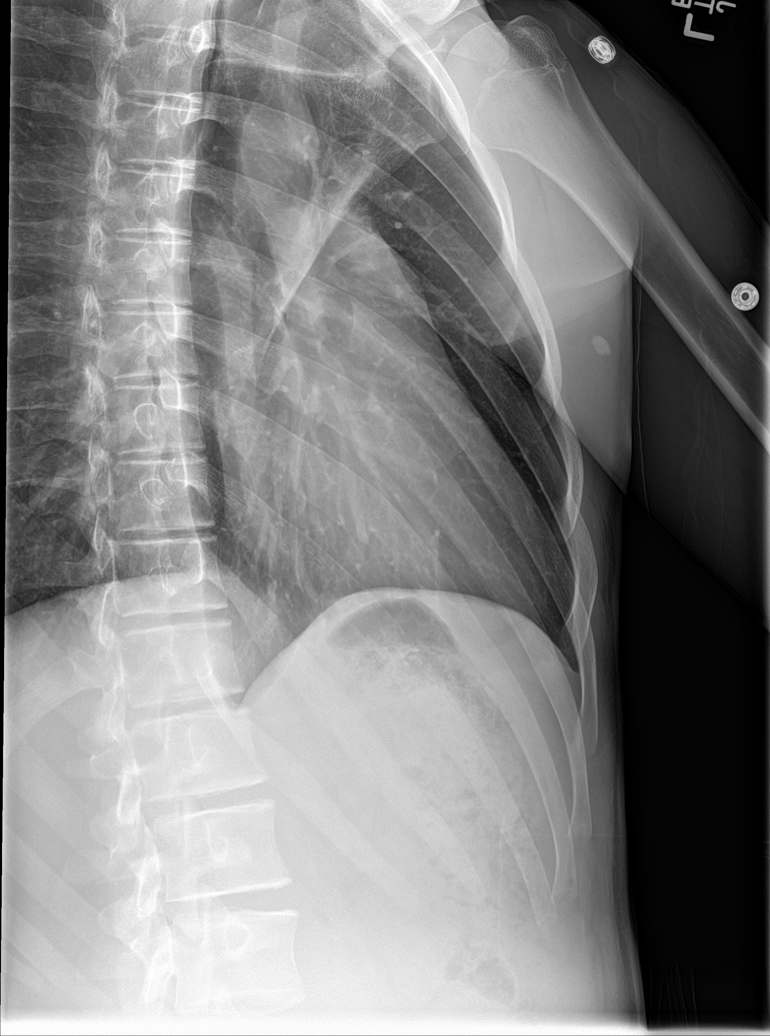

[5 of 5 positions shown; findings below may reference images not displayed]

FINDINGS: Frontal chest as well as oblique and cone-down lower rib images
obtained. Lungs are clear. Heart size and pulmonary vascularity are
normal. No adenopathy. No pneumothorax or effusion. No rib fracture
evident.
IMPRESSION: No rib fracture evident.  Lungs clear.

## 2018-03-28 ENCOUNTER — Encounter: Payer: Self-pay | Admitting: Pediatrics

## 2018-03-28 DIAGNOSIS — Z1379 Encounter for other screening for genetic and chromosomal anomalies: Secondary | ICD-10-CM | POA: Insufficient documentation

## 2018-07-09 ENCOUNTER — Other Ambulatory Visit: Payer: Self-pay

## 2018-07-09 ENCOUNTER — Encounter (HOSPITAL_BASED_OUTPATIENT_CLINIC_OR_DEPARTMENT_OTHER): Payer: Self-pay | Admitting: Emergency Medicine

## 2018-07-09 ENCOUNTER — Emergency Department (HOSPITAL_BASED_OUTPATIENT_CLINIC_OR_DEPARTMENT_OTHER)
Admission: EM | Admit: 2018-07-09 | Discharge status: Against Medical Advice | Payer: Medicaid Other | Source: Home / Self Care

## 2018-07-09 LAB — URINALYSIS, ROUTINE W REFLEX MICROSCOPIC

## 2018-07-09 LAB — URINALYSIS, MICROSCOPIC (REFLEX)

## 2018-07-09 NOTE — ED Notes (Signed)
This patient was not seen in the Emergency Department. Patient was incorrectly registered.

## 2018-07-09 NOTE — ED Triage Notes (Signed)
Pt states he noticed some secretion coming out of his penis since yesterday, no urinary symptoms.

## 2020-04-05 ENCOUNTER — Other Ambulatory Visit: Payer: Medicaid Other

## 2020-04-05 DIAGNOSIS — Z20822 Contact with and (suspected) exposure to covid-19: Secondary | ICD-10-CM

## 2020-04-06 LAB — SARS-COV-2, NAA 2 DAY TAT

## 2020-04-06 LAB — NOVEL CORONAVIRUS, NAA: SARS-CoV-2, NAA: NOT DETECTED

## 2020-04-21 ENCOUNTER — Other Ambulatory Visit: Payer: Self-pay

## 2020-04-21 ENCOUNTER — Other Ambulatory Visit: Payer: Medicaid Other

## 2020-04-21 DIAGNOSIS — Z20822 Contact with and (suspected) exposure to covid-19: Secondary | ICD-10-CM

## 2020-04-22 LAB — NOVEL CORONAVIRUS, NAA: SARS-CoV-2, NAA: NOT DETECTED

## 2020-04-22 LAB — SARS-COV-2, NAA 2 DAY TAT
